# Patient Record
Sex: Male | Born: 1970 | Race: White | Hispanic: No | Marital: Single | Smoking: Current every day smoker
Health system: Southern US, Community
[De-identification: ages and names within clinical notes are randomized; demographics above are authoritative.]

## PROBLEM LIST (undated history)

## (undated) DIAGNOSIS — I10 Essential (primary) hypertension: Secondary | ICD-10-CM

---

## 2015-01-12 ENCOUNTER — Emergency Department (INDEPENDENT_AMBULATORY_CARE_PROVIDER_SITE_OTHER): Payer: Medicaid Other

## 2015-01-12 ENCOUNTER — Encounter (HOSPITAL_COMMUNITY): Payer: Self-pay | Admitting: *Deleted

## 2015-01-12 ENCOUNTER — Emergency Department (HOSPITAL_COMMUNITY)
Admission: EM | Admit: 2015-01-12 | Discharge: 2015-01-12 | Disposition: A | Discharge status: Home / Self Care | Payer: Medicaid Other | Source: Home / Self Care | Attending: Family Medicine | Admitting: Family Medicine

## 2015-01-12 DIAGNOSIS — M67431 Ganglion, right wrist: Secondary | ICD-10-CM

## 2015-01-12 DIAGNOSIS — B353 Tinea pedis: Secondary | ICD-10-CM

## 2015-01-12 HISTORY — DX: Essential (primary) hypertension: I10

## 2015-01-12 MED ORDER — TERBINAFINE HCL 250 MG PO TABS: 250.0000 mg | ORAL_TABLET | Freq: Every day | ORAL | Status: DC

## 2015-01-12 NOTE — ED Provider Notes (Signed)
CSN: [id number]     Arrival date & time 01/12/15  1453 History   None    Chief Complaint  Patient presents with  . Hand Problem   (Consider location/radiation/quality/duration/timing/severity/associated sxs/prior Treatment) Patient is a 44 y.o. male presenting with wrist pain and rash. The history is provided by the patient.  Wrist Pain This is a chronic problem. The current episode started more than 1 week ago (a year.). The problem has not changed since onset. Rash Location:  Foot Foot rash location:  R toes and L toes Quality: peeling and redness   Severity:  Moderate Onset quality:  Gradual Progression:  Worsening Chronicity:  Chronic   Past Medical History  Diagnosis Date  . Hypertension    History reviewed. No pertinent past surgical history. History reviewed. No pertinent family history. Social History  Substance Use Topics  . Smoking status: Current Every Day Smoker  . Smokeless tobacco: None  . Alcohol Use: No    Review of Systems  Constitutional: Negative.   Musculoskeletal: Positive for joint swelling.  Skin: Positive for rash.    Allergies  Review of patient's allergies indicates no known allergies.  Home Medications   Prior to Admission medications   Medication Sig Start Date End Date Taking? Authorizing Provider  terbinafine (LAMISIL) 250 MG tablet Take 1 tablet (250 mg total) by mouth daily. 01/12/15   Linna Hoff, MD   There were no vitals taken for this visit. Physical Exam  Constitutional: He is oriented to person, place, and time. He appears well-developed and well-nourished. No distress.  Musculoskeletal: He exhibits tenderness.       Right wrist: He exhibits decreased range of motion, tenderness, bony tenderness and swelling. He exhibits no deformity.  Neurological: He is alert and oriented to person, place, and time.  Skin: Skin is warm and dry. Rash noted. There is erythema.  Tinea pedis bilat  Nursing note and vitals reviewed.   ED  Course  Procedures (including critical care time) Labs Review Labs Reviewed - No data to display  Imaging Review Dg Wrist Complete Right  01/12/2015   CLINICAL DATA:  Right wrist pain for 2 years. No known injury. Initial encounter.  EXAM: RIGHT WRIST - COMPLETE 3+ VIEW  COMPARISON:  None.  FINDINGS: There is no evidence of fracture or dislocation. There is no evidence of arthropathy or other focal bone abnormality. Soft tissues are unremarkable.  IMPRESSION: Negative exam.   Electronically Signed   By: Drusilla Kanner M.D.   On: 01/12/2015 16:25     MDM   1. Ganglion cyst of wrist, right   2. Tinea pedis of both feet        Linna Hoff, MD 01/12/15 [phone]

## 2015-01-12 NOTE — Discharge Instructions (Signed)
Wear splint as needed, see specialist if further problems, take all of pills of foot rash.

## 2015-01-12 NOTE — ED Notes (Signed)
Pt  Has   Some  Swelling  To the  r  Hand  He  Reports he  Has  Had  For   What  He  Describes  As  Months      He  Also has  atheletes  Foot of  Both of his  Feet

## 2016-01-05 ENCOUNTER — Ambulatory Visit
Admission: RE | Admit: 2016-01-05 | Discharge: 2016-01-05 | Disposition: A | Discharge status: Home / Self Care | Payer: No Typology Code available for payment source | Source: Ambulatory Visit | Attending: Infectious Disease | Admitting: Infectious Disease

## 2016-01-05 ENCOUNTER — Other Ambulatory Visit: Payer: Self-pay | Admitting: Infectious Disease

## 2016-01-05 DIAGNOSIS — R7611 Nonspecific reaction to tuberculin skin test without active tuberculosis: Secondary | ICD-10-CM

## 2016-04-22 ENCOUNTER — Other Ambulatory Visit: Payer: Self-pay | Admitting: Infectious Disease

## 2016-04-22 ENCOUNTER — Ambulatory Visit
Admission: RE | Admit: 2016-04-22 | Discharge: 2016-04-22 | Disposition: A | Discharge status: Home / Self Care | Payer: No Typology Code available for payment source | Source: Ambulatory Visit | Attending: Infectious Disease | Admitting: Infectious Disease

## 2016-04-22 DIAGNOSIS — A150 Tuberculosis of lung: Secondary | ICD-10-CM

## 2016-10-22 ENCOUNTER — Encounter (HOSPITAL_COMMUNITY): Payer: Self-pay | Admitting: *Deleted

## 2016-10-22 ENCOUNTER — Emergency Department (HOSPITAL_COMMUNITY)
Admission: EM | Admit: 2016-10-22 | Discharge: 2016-10-22 | Disposition: A | Discharge status: Home / Self Care | Payer: No Typology Code available for payment source | Attending: Emergency Medicine | Admitting: Emergency Medicine

## 2016-10-22 DIAGNOSIS — F17200 Nicotine dependence, unspecified, uncomplicated: Secondary | ICD-10-CM | POA: Insufficient documentation

## 2016-10-22 DIAGNOSIS — I10 Essential (primary) hypertension: Secondary | ICD-10-CM | POA: Insufficient documentation

## 2016-10-22 DIAGNOSIS — K644 Residual hemorrhoidal skin tags: Secondary | ICD-10-CM | POA: Insufficient documentation

## 2016-10-22 MED ORDER — LIDOCAINE HCL 2 % EX GEL
1.0000 "application " | Freq: Once | CUTANEOUS | Status: AC
  Administered 2016-10-22: 1
  Filled 2016-10-22: qty 11

## 2016-10-22 MED ORDER — POLYETHYLENE GLYCOL 3350 17 GM/SCOOP PO POWD: 17.0000 g | Freq: Every day | ORAL | 0 refills | Status: DC

## 2016-10-22 MED ORDER — LIDOCAINE-HYDROCORTISONE ACE 3-2.5 % RE KIT: 1.0000 "application " | PACK | Freq: Two times a day (BID) | RECTAL | 0 refills | Status: DC

## 2016-10-22 NOTE — ED Notes (Signed)
Patient is alert and oriented x3.  He was given DC instructions and follow up visit instructions.  Patient gave verbal understanding.  He was DC ambulatory under his own power to home.  V/S stable.  He was not showing any signs of distress on DC 

## 2016-10-22 NOTE — ED Provider Notes (Signed)
[facility]-EMERGENCY DEPT Provider Note   CSN: [id number] Arrival date & time: 10/22/16  0740     History   Chief Complaint Chief Complaint  Patient presents with  . Rectal Pain    HPI Travis Foley is a 46 y.o. male.  The history is provided by the patient.   46 year old male who presents with rectal pain starting yesterday evening. Gradually worsened throughout the past day. Denies constipation or straining, with last normal BM this morning 1 hour PTA. No currently rectal bleeding, but reports he has noted bright red blood Per rectum in the past. He did have some mild epigastric discomfort yesterday, which is now resolved. No nausea, vomiting, fevers or chills, or dysuria. No alleviating factors. States that whenever he sits down, pain is worse. Did not take any medications for his symptoms.  Past Medical History:  Diagnosis Date  . Hypertension     There are no active problems to display for this patient.   History reviewed. No pertinent surgical history.     Home Medications    Prior to Admission medications   Not on File    Family History History reviewed. No pertinent family history.  Social History Social History  Substance Use Topics  . Smoking status: Current Every Day Smoker  . Smokeless tobacco: Never Used  . Alcohol use No     Allergies   Patient has no known allergies.   Review of Systems Review of Systems  Constitutional: Negative for fever.  Respiratory: Negative for shortness of breath.   Gastrointestinal: Positive for rectal pain. Negative for abdominal pain.  Genitourinary: Negative for dysuria.  Allergic/Immunologic: Negative for immunocompromised state.  Hematological: Does not bruise/bleed easily.  All other systems reviewed and are negative.    Physical Exam Updated Vital Signs BP (!) 144/92 (BP Location: Right Arm)   Pulse 90   Temp 98.5 F (36.9 C) (Oral)   Resp 18   Ht 5' 9.5" (1.765 m) Comment: Simultaneous filing. User  may not have seen previous data.  Wt 100 kg (220 lb 7.4 oz)   SpO2 99%   BMI 32.09 kg/m   Physical Exam Physical Exam  Nursing note and vitals reviewed. Constitutional: Well developed, well nourished, non-toxic, and in no acute distress Head: Normocephalic and atraumatic.  Mouth/Throat: Oropharynx is clear and moist.  Neck: Normal range of motion. Neck supple.  Cardiovascular: Normal rate and regular rhythm.   Pulmonary/Chest: Effort normal and breath sounds normal.  Abdominal: Soft. There is no tenderness. There is no rebound and no guarding. On rectal exam, there are three large external hemorrhoids, with mild thrombosis of one of the external hemorrhoids. Musculoskeletal: Normal range of motion.  Neurological: Alert, no facial droop, fluent speech, moves all extremities symmetrically Skin: Skin is warm and dry.  Psychiatric: Cooperative   ED Treatments / Results  Labs (all labs ordered are listed, but only abnormal results are displayed) Labs Reviewed - No data to display  EKG  EKG Interpretation None       Radiology No results found.  Procedures Procedures (including critical care time)  Medications Ordered in ED Medications  lidocaine (XYLOCAINE) 2 % jelly 1 application (not administered)     Initial Impression / Assessment and Plan / ED Course  I have reviewed the triage vital signs and the nursing notes.  Pertinent labs & imaging results that were available during my care of the patient were reviewed by me and considered in my medical decision making (see chart  for details).     Presenting with rectal pain. External hemorrhoids on exam causing symptoms. Abdomen soft and benign/nontender. Will treat supportively with external cream and stool softeners. Referral to general surgery provided.   Final Clinical Impressions(s) / ED Diagnoses   Final diagnoses:  External hemorrhoids    New Prescriptions New Prescriptions   No medications on file       Lavera GuiseLiu, Dana Duo, MD 10/22/16 [phone]

## 2016-10-22 NOTE — Discharge Instructions (Signed)
You have external hemorrhoids that are very painful.  Having softer stools will prevent them from worsening. Use rectal cream as prescribed. You are given referral to general surgeon, as often these may need to be excised. Please call to set up close follow-up appointment

## 2016-10-22 NOTE — ED Triage Notes (Signed)
Patient is alert and oriented x4.  He is being seen for rectal pain that started last night.  Patient states that he has had some blood in his stool that was bright red.  Currently he rates his pain 10 of 10.

## 2017-05-06 ENCOUNTER — Encounter (HOSPITAL_COMMUNITY): Payer: Self-pay | Admitting: Emergency Medicine

## 2017-05-06 ENCOUNTER — Emergency Department (HOSPITAL_COMMUNITY)
Admission: EM | Admit: 2017-05-06 | Discharge: 2017-05-06 | Disposition: A | Discharge status: Home / Self Care | Payer: No Typology Code available for payment source | Attending: Emergency Medicine | Admitting: Emergency Medicine

## 2017-05-06 DIAGNOSIS — I10 Essential (primary) hypertension: Secondary | ICD-10-CM | POA: Insufficient documentation

## 2017-05-06 DIAGNOSIS — F17200 Nicotine dependence, unspecified, uncomplicated: Secondary | ICD-10-CM | POA: Insufficient documentation

## 2017-05-06 DIAGNOSIS — Z79899 Other long term (current) drug therapy: Secondary | ICD-10-CM | POA: Insufficient documentation

## 2017-05-06 DIAGNOSIS — K029 Dental caries, unspecified: Secondary | ICD-10-CM | POA: Insufficient documentation

## 2017-05-06 MED ORDER — IBUPROFEN 200 MG PO TABS
400.0000 mg | ORAL_TABLET | Freq: Once | ORAL | Status: AC | PRN
  Administered 2017-05-06: 400 mg via ORAL
  Filled 2017-05-06: qty 2

## 2017-05-06 MED ORDER — HYDROCODONE-ACETAMINOPHEN 5-325 MG PO TABS: 1.0000 | ORAL_TABLET | ORAL | 0 refills | Status: DC | PRN

## 2017-05-06 MED ORDER — IBUPROFEN 600 MG PO TABS: 600.0000 mg | ORAL_TABLET | Freq: Four times a day (QID) | ORAL | 0 refills | Status: DC | PRN

## 2017-05-06 MED ORDER — HYDROCODONE-ACETAMINOPHEN 5-325 MG PO TABS
1.0000 | ORAL_TABLET | Freq: Once | ORAL | Status: AC
  Administered 2017-05-06: 1 via ORAL
  Filled 2017-05-06: qty 1

## 2017-05-06 MED ORDER — AMOXICILLIN 500 MG PO CAPS
500.0000 mg | ORAL_CAPSULE | Freq: Once | ORAL | Status: AC
  Administered 2017-05-06: 500 mg via ORAL
  Filled 2017-05-06: qty 1

## 2017-05-06 MED ORDER — AMOXICILLIN 500 MG PO CAPS: 500.0000 mg | ORAL_CAPSULE | Freq: Three times a day (TID) | ORAL | 0 refills | Status: DC

## 2017-05-06 NOTE — ED Triage Notes (Signed)
Patient c/o left facial swelling with dental pain for past 3 days. Denies difficulty eating or drinking. Denies difficulty breathing.

## 2017-05-06 NOTE — ED Provider Notes (Signed)
[facility] DEPT Provider Note   CSN: [id number] Arrival date & time: 05/06/17  1752     History   Chief Complaint Chief Complaint  Patient presents with  . Facial Swelling  . Dental Pain    HPI Travis Foley is a 46 y.o. male.  Pt presents to the ED today with left facial swelling and dental pain for the past 3 days.  The pt speaks Arabic and information is obtained via Opt[facility].  The pt denies any fever.  He does not have a Pharmacist, community.      Past Medical History:  Diagnosis Date  . Hypertension     There are no active problems to display for this patient.   History reviewed. No pertinent surgical history.     Home Medications    Prior to Admission medications   Medication Sig Start Date End Date Taking? Authorizing Provider  amoxicillin (AMOXIL) 500 MG capsule Take 1 capsule (500 mg total) by mouth 3 (three) times daily. 05/06/17   Isla Pence, MD  HYDROcodone-acetaminophen (NORCO/VICODIN) 5-325 MG tablet Take 1 tablet by mouth every 4 (four) hours as needed. 05/06/17   Isla Pence, MD  ibuprofen (ADVIL,MOTRIN) 600 MG tablet Take 1 tablet (600 mg total) by mouth every 6 (six) hours as needed. 05/06/17   Isla Pence, MD  Lidocaine-Hydrocortisone Ace 3-2.5 % KIT Place 1 application rectally 2 (two) times daily. 10/22/16   Forde Dandy, MD  polyethylene glycol powder (GLYCOLAX/MIRALAX) powder Take 17 g by mouth daily. Dissolve one capful of powder into any liquid and take once daily to soften stools. 10/22/16   Forde Dandy, MD    Family History No family history on file.  Social History Social History   Tobacco Use  . Smoking status: Current Every Day Smoker  . Smokeless tobacco: Never Used  Substance Use Topics  . Alcohol use: No  . Drug use: No     Allergies   Patient has no known allergies.   Review of Systems Review of Systems  HENT: Positive for dental problem.   All other systems reviewed and are  negative.    Physical Exam Updated Vital Signs BP (!) 164/96 (BP Location: Left Arm)   Pulse 83   Temp 98.4 F (36.9 C) (Oral)   Resp 18   Wt 108.9 kg (240 lb)   SpO2 98%   BMI 34.93 kg/m   Physical Exam  Constitutional: He is oriented to person, place, and time. He appears well-developed and well-nourished.  HENT:  Head: Normocephalic and atraumatic.  Right Ear: External ear normal.  Nose: Nose normal.  Mouth/Throat: Dental caries present.  Left upper teeth tenderness.  Left facial swelling.  No Ludwig's angina.  Eyes: EOM are normal. Pupils are equal, round, and reactive to light.  Neck: Normal range of motion. Neck supple.  Cardiovascular: Normal rate, regular rhythm, normal heart sounds and intact distal pulses.  Pulmonary/Chest: Effort normal and breath sounds normal.  Abdominal: Soft. Bowel sounds are normal.  Musculoskeletal: Normal range of motion.  Neurological: He is alert and oriented to person, place, and time.  Skin: Skin is warm. Capillary refill takes less than 2 seconds.  Psychiatric: He has a normal mood and affect. His behavior is normal. Judgment and thought content normal.  Nursing note and vitals reviewed.    ED Treatments / Results  Labs (all labs ordered are listed, but only abnormal results are displayed) Labs Reviewed - No data to display  EKG  EKG Interpretation None       Radiology No results found.  Procedures Procedures (including critical care time)  Medications Ordered in ED Medications  amoxicillin (AMOXIL) capsule 500 mg (not administered)  HYDROcodone-acetaminophen (NORCO/VICODIN) 5-325 MG per tablet 1 tablet (not administered)  ibuprofen (ADVIL,MOTRIN) tablet 400 mg (400 mg Oral Given 05/06/17 1814)     Initial Impression / Assessment and Plan / ED Course  I have reviewed the triage vital signs and the nursing notes.  Pertinent labs & imaging results that were available during my care of the patient were reviewed by  me and considered in my medical decision making (see chart for details).  Pt will be given a dose of amox and lortab prior to d/c.  He will be given the number of the dental clinic.  He knows to return if worse.   Final Clinical Impressions(s) / ED Diagnoses   Final diagnoses:  Dental caries    ED Discharge Orders        Ordered    amoxicillin (AMOXIL) 500 MG capsule  3 times daily     05/06/17 2147    HYDROcodone-acetaminophen (NORCO/VICODIN) 5-325 MG tablet  Every 4 hours PRN     05/06/17 2147    ibuprofen (ADVIL,MOTRIN) 600 MG tablet  Every 6 hours PRN     05/06/17 2147       Isla Pence, MD 05/06/17 2151

## 2020-09-10 ENCOUNTER — Other Ambulatory Visit: Payer: Self-pay

## 2020-09-10 ENCOUNTER — Emergency Department (HOSPITAL_COMMUNITY)
Admission: EM | Admit: 2020-09-10 | Discharge: 2020-09-11 | Disposition: A | Discharge status: Home / Self Care | Payer: No Typology Code available for payment source | Attending: Emergency Medicine | Admitting: Emergency Medicine

## 2020-09-10 ENCOUNTER — Encounter (HOSPITAL_COMMUNITY): Payer: Self-pay | Admitting: Emergency Medicine

## 2020-09-10 DIAGNOSIS — M545 Low back pain, unspecified: Secondary | ICD-10-CM | POA: Diagnosis not present

## 2020-09-10 DIAGNOSIS — R202 Paresthesia of skin: Secondary | ICD-10-CM | POA: Insufficient documentation

## 2020-09-10 DIAGNOSIS — M25519 Pain in unspecified shoulder: Secondary | ICD-10-CM | POA: Insufficient documentation

## 2020-09-10 DIAGNOSIS — Y92410 Unspecified street and highway as the place of occurrence of the external cause: Secondary | ICD-10-CM | POA: Insufficient documentation

## 2020-09-10 DIAGNOSIS — R519 Headache, unspecified: Secondary | ICD-10-CM | POA: Insufficient documentation

## 2020-09-10 DIAGNOSIS — I10 Essential (primary) hypertension: Secondary | ICD-10-CM | POA: Diagnosis not present

## 2020-09-10 DIAGNOSIS — R911 Solitary pulmonary nodule: Secondary | ICD-10-CM | POA: Diagnosis not present

## 2020-09-10 DIAGNOSIS — M542 Cervicalgia: Secondary | ICD-10-CM | POA: Insufficient documentation

## 2020-09-10 DIAGNOSIS — F17200 Nicotine dependence, unspecified, uncomplicated: Secondary | ICD-10-CM | POA: Diagnosis not present
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
## 2020-09-11 ENCOUNTER — Emergency Department (HOSPITAL_COMMUNITY): Payer: No Typology Code available for payment source

## 2020-09-11 MED ORDER — METHOCARBAMOL 500 MG PO TABS: 500.0000 mg | ORAL_TABLET | Freq: Two times a day (BID) | ORAL | 0 refills | Status: AC

## 2020-09-11 MED ORDER — METHOCARBAMOL 500 MG PO TABS
1000.0000 mg | ORAL_TABLET | Freq: Once | ORAL | Status: AC
  Administered 2020-09-11: 1000 mg via ORAL
  Filled 2020-09-11: qty 2

## 2020-09-11 MED ORDER — NAPROXEN 500 MG PO TABS: 500.0000 mg | ORAL_TABLET | Freq: Two times a day (BID) | ORAL | 0 refills | Status: AC

## 2020-09-11 MED ORDER — IBUPROFEN 800 MG PO TABS
800.0000 mg | ORAL_TABLET | Freq: Once | ORAL | Status: AC
  Administered 2020-09-11: 800 mg via ORAL
  Filled 2020-09-11: qty 1

## 2020-09-11 NOTE — ED Notes (Signed)
E-signature pad unavailable at time of pt discharge. This RN discussed discharge materials with pt and answered all pt questions. Pt stated understanding of discharge material. ? ?
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
## 2020-09-11 NOTE — Discharge Instructions (Signed)

## 2020-09-23 ENCOUNTER — Ambulatory Visit (INDEPENDENT_AMBULATORY_CARE_PROVIDER_SITE_OTHER): Payer: No Typology Code available for payment source | Admitting: Family Medicine

## 2020-09-23 ENCOUNTER — Other Ambulatory Visit: Payer: Self-pay

## 2020-09-23 DIAGNOSIS — F4310 Post-traumatic stress disorder, unspecified: Secondary | ICD-10-CM

## 2020-09-23 NOTE — Progress Notes (Signed)
Patient was seen and examined for N-[id number].  °Mendi Constable, MD  °Family Medicine Teaching Service  ° °
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
